# Patient Record
Sex: Female | Born: 1960 | Race: White | Hispanic: No | Marital: Married | Smoking: Current every day smoker
Health system: Southern US, Community
[De-identification: ages and names within clinical notes are randomized; demographics above are authoritative.]

## PROBLEM LIST (undated history)

## (undated) HISTORY — PX: BACK SURGERY: SHX140

## (undated) HISTORY — PX: TUBAL LIGATION: SHX77

## (undated) HISTORY — PX: ABDOMINAL HYSTERECTOMY: SHX81

---

## 2001-09-16 ENCOUNTER — Encounter: Payer: Self-pay | Admitting: Orthopedic Surgery

## 2001-09-16 ENCOUNTER — Ambulatory Visit (HOSPITAL_COMMUNITY): Admission: RE | Admit: 2001-09-16 | Discharge: 2001-09-16 | Payer: Self-pay | Admitting: Orthopedic Surgery

## 2004-12-20 ENCOUNTER — Ambulatory Visit: Payer: Self-pay | Admitting: Internal Medicine

## 2007-01-24 ENCOUNTER — Ambulatory Visit: Payer: Self-pay | Admitting: Family Medicine

## 2008-04-07 ENCOUNTER — Ambulatory Visit: Payer: Self-pay | Admitting: General Practice

## 2009-12-21 ENCOUNTER — Ambulatory Visit: Payer: Self-pay | Admitting: Obstetrics and Gynecology

## 2009-12-27 ENCOUNTER — Ambulatory Visit: Payer: Self-pay | Admitting: Obstetrics and Gynecology

## 2010-04-18 ENCOUNTER — Ambulatory Visit: Payer: Self-pay | Admitting: Family Medicine

## 2011-10-26 ENCOUNTER — Ambulatory Visit: Payer: Self-pay | Admitting: Family Medicine

## 2011-12-18 ENCOUNTER — Ambulatory Visit: Payer: Self-pay | Admitting: Family Medicine

## 2013-04-24 ENCOUNTER — Ambulatory Visit: Payer: Self-pay | Admitting: Family Medicine

## 2013-10-29 ENCOUNTER — Ambulatory Visit: Payer: Self-pay | Admitting: Family Medicine

## 2014-11-10 ENCOUNTER — Other Ambulatory Visit: Payer: Self-pay | Admitting: Family Medicine

## 2014-11-10 DIAGNOSIS — Z1231 Encounter for screening mammogram for malignant neoplasm of breast: Secondary | ICD-10-CM

## 2014-11-19 ENCOUNTER — Ambulatory Visit
Admission: RE | Admit: 2014-11-19 | Discharge: 2014-11-19 | Disposition: A | Discharge status: Home / Self Care | Payer: BLUE CROSS/BLUE SHIELD | Source: Ambulatory Visit | Attending: Internal Medicine | Admitting: Internal Medicine

## 2014-11-19 DIAGNOSIS — Z1231 Encounter for screening mammogram for malignant neoplasm of breast: Secondary | ICD-10-CM | POA: Insufficient documentation

## 2015-10-04 ENCOUNTER — Ambulatory Visit
Admission: RE | Admit: 2015-10-04 | Discharge: 2015-10-04 | Disposition: A | Discharge status: Home / Self Care | Payer: BLUE CROSS/BLUE SHIELD | Source: Ambulatory Visit | Attending: Family Medicine | Admitting: Family Medicine

## 2015-10-04 ENCOUNTER — Other Ambulatory Visit: Payer: Self-pay | Admitting: Family Medicine

## 2015-10-04 DIAGNOSIS — R0602 Shortness of breath: Secondary | ICD-10-CM

## 2015-10-04 DIAGNOSIS — R05 Cough: Secondary | ICD-10-CM

## 2015-10-04 DIAGNOSIS — R059 Cough, unspecified: Secondary | ICD-10-CM

## 2016-11-07 IMAGING — CR DG CHEST 2V
2 series · 2 of 2 positions shown · non-contrast
Comparison: 04/24/2013 and earlier.

CLINICAL DATA: 54-year-old female with shortness of breath wheezing
and cough for 7 days. Initial encounter. Smoker.

EXAM:
CHEST  2 VIEW

[chest pa]
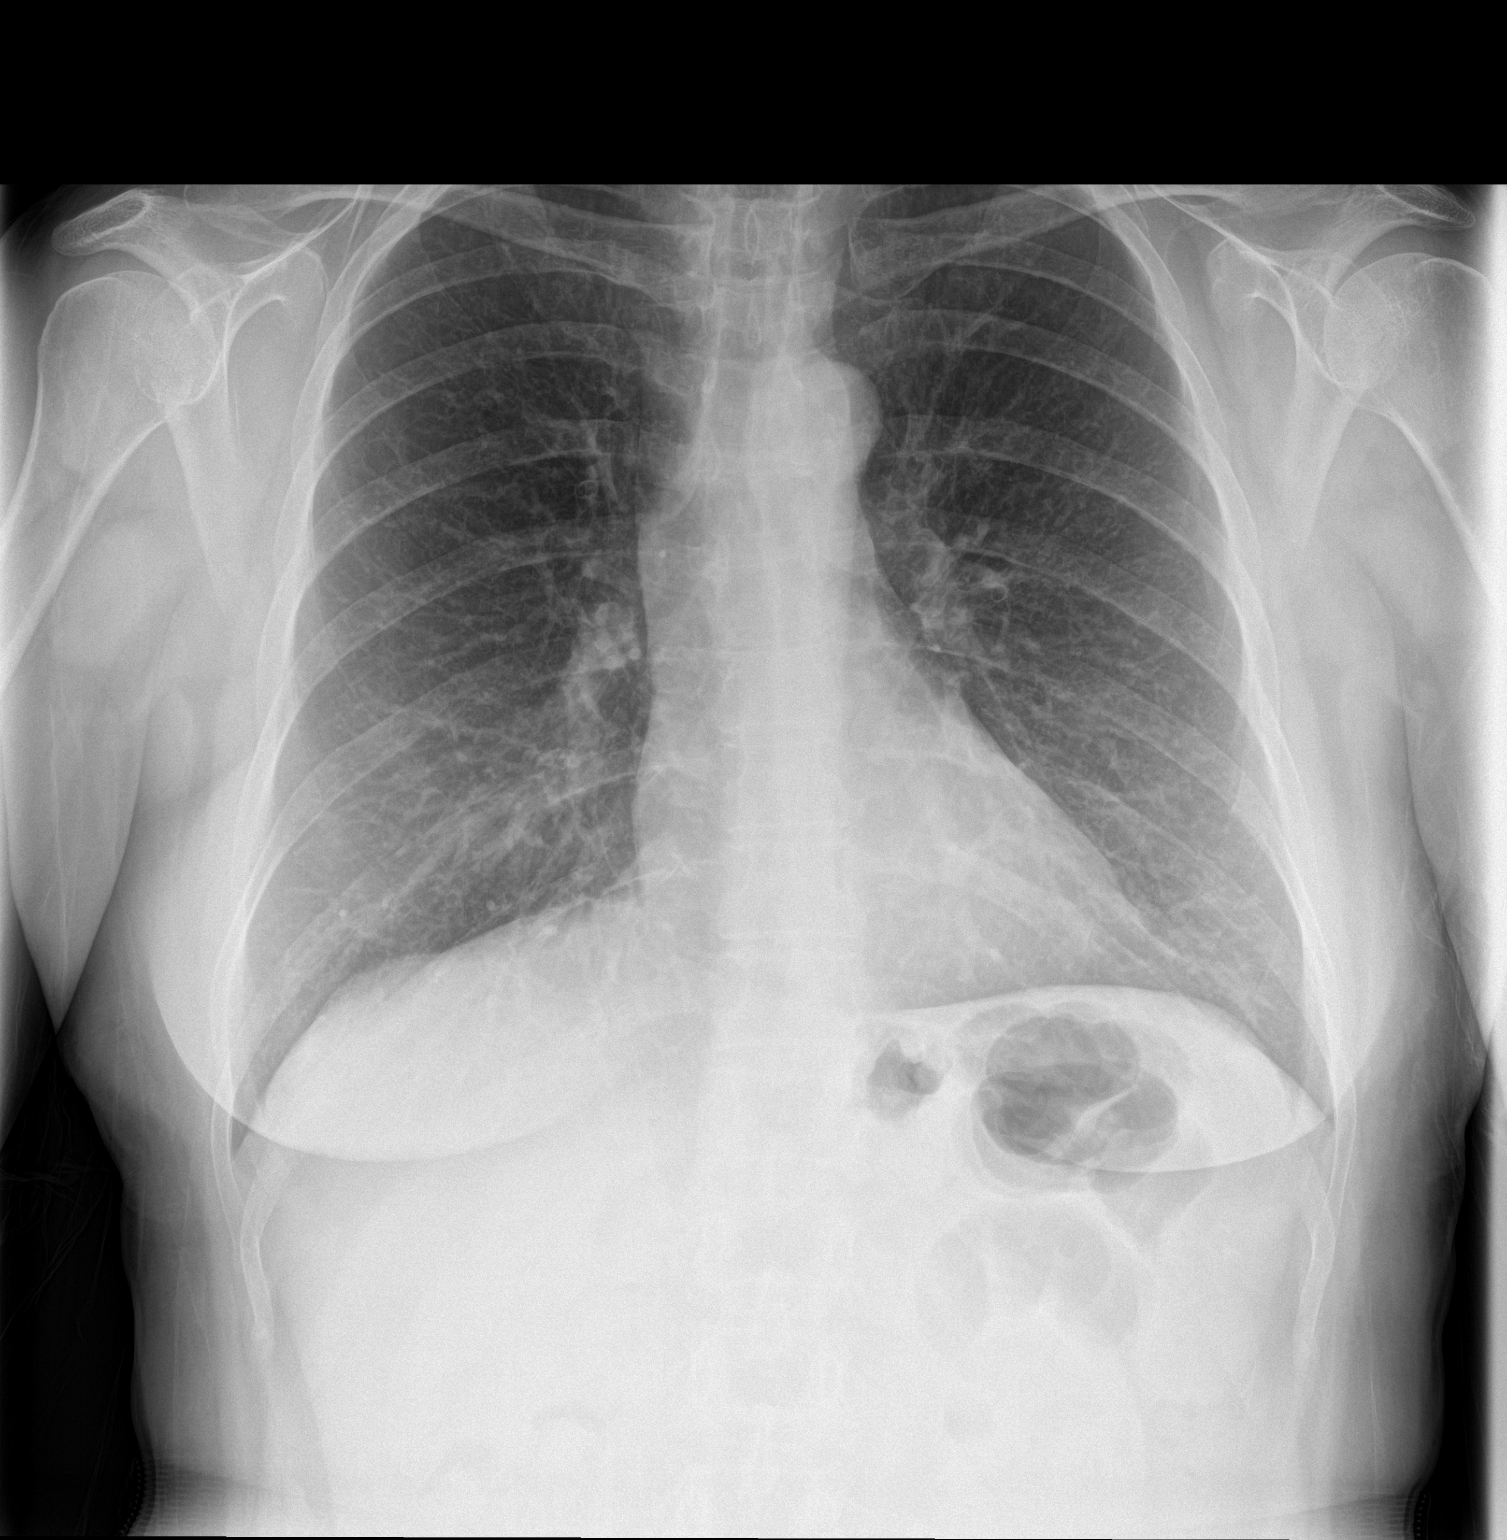

[chest lat]
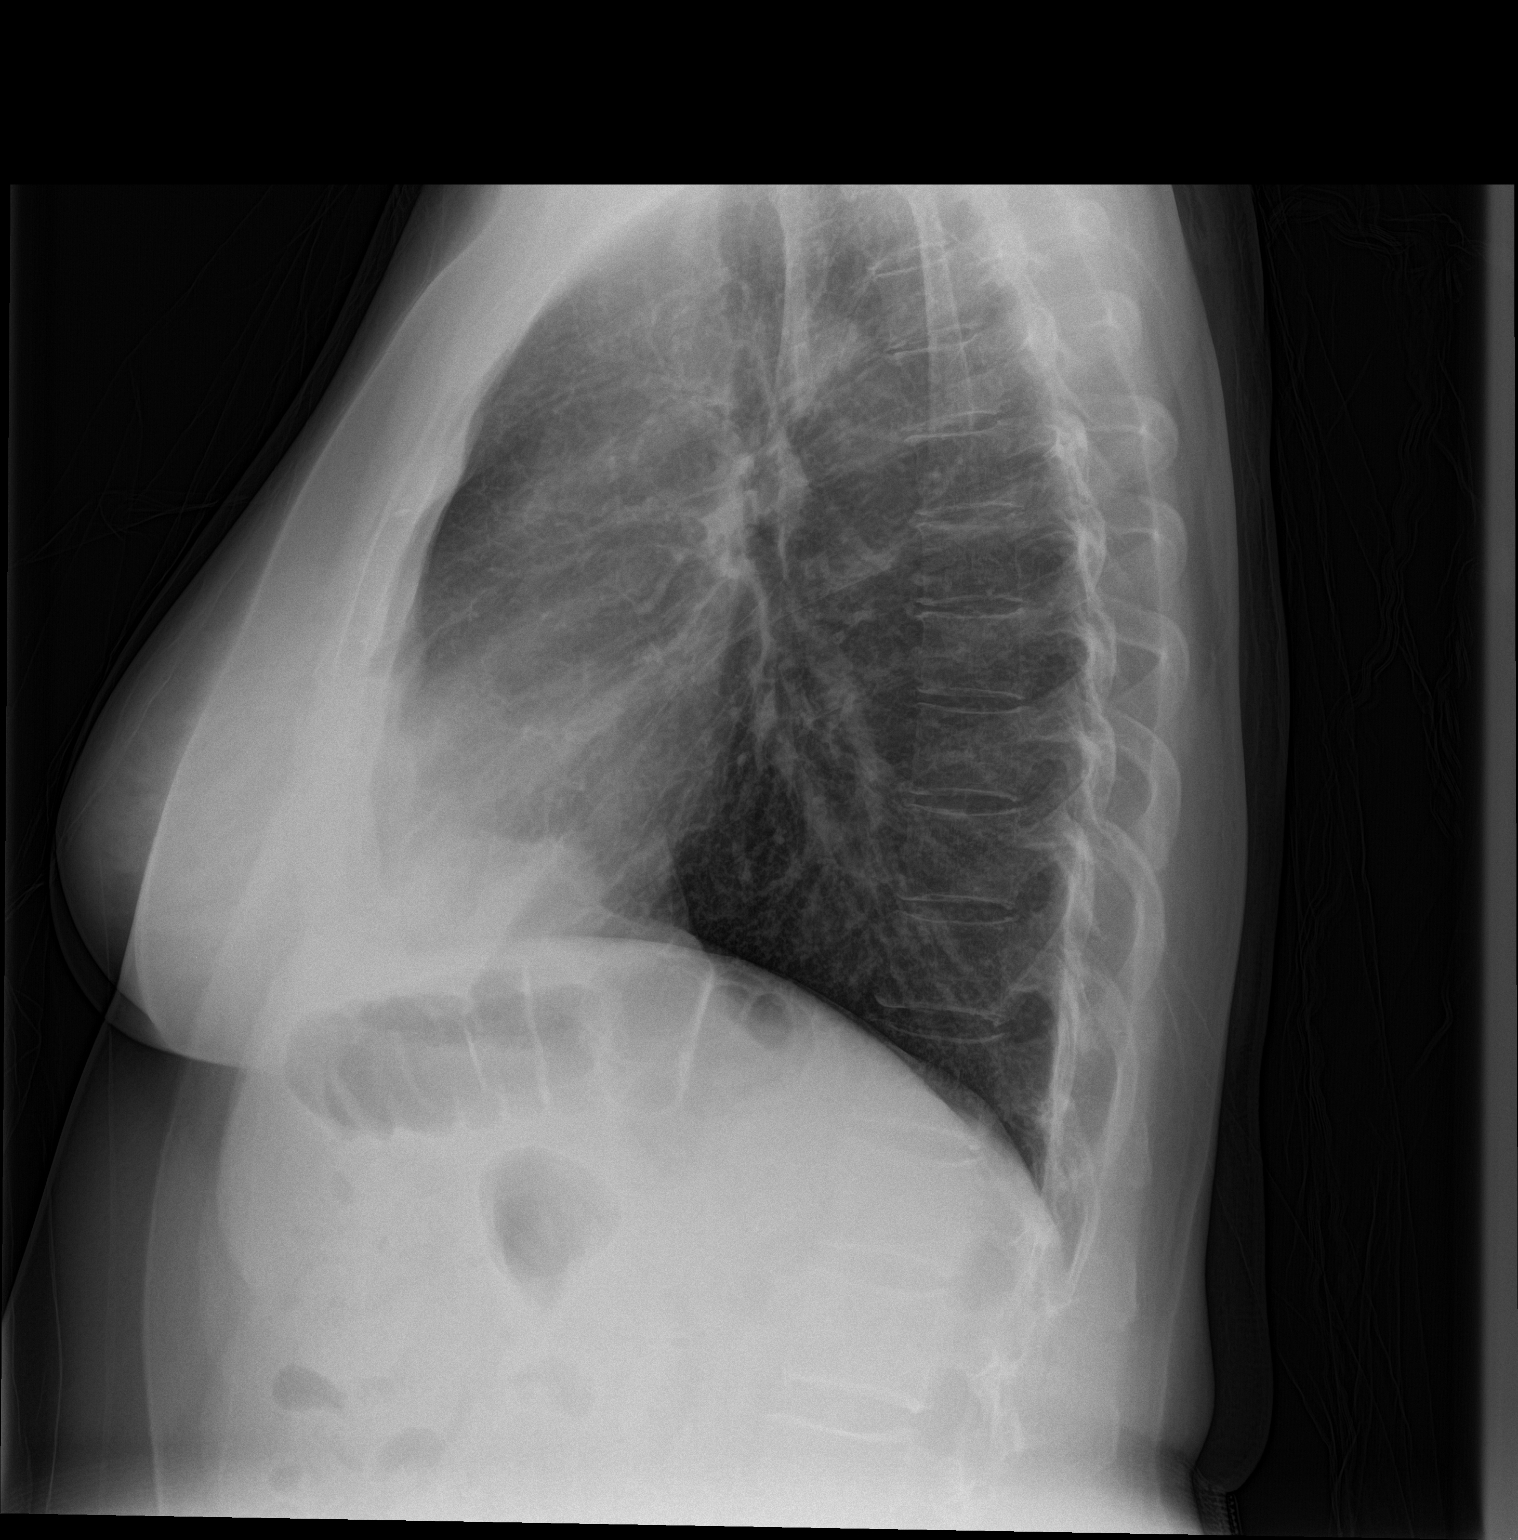

[2 of 2 positions shown; findings below may reference images not displayed]

FINDINGS: Lung volumes are stable and within normal limits. Normal cardiac
size and mediastinal contours. Visualized tracheal air column is
within normal limits. No pneumothorax, pulmonary edema, pleural
effusion or consolidation. Basilar predominant increased
interstitial markings have mildly progressed since 2041. No
confluent pulmonary opacity. No acute osseous abnormality
identified.
IMPRESSION: No acute cardiopulmonary abnormality.

## 2020-03-15 ENCOUNTER — Other Ambulatory Visit: Payer: Self-pay

## 2020-03-15 ENCOUNTER — Ambulatory Visit
Admission: EM | Admit: 2020-03-15 | Discharge: 2020-03-15 | Disposition: A | Discharge status: Home / Self Care | Payer: BC Managed Care – PPO | Attending: Emergency Medicine | Admitting: Emergency Medicine

## 2020-03-15 ENCOUNTER — Encounter: Payer: Self-pay | Admitting: Emergency Medicine

## 2020-03-15 DIAGNOSIS — Z20822 Contact with and (suspected) exposure to covid-19: Secondary | ICD-10-CM

## 2020-03-15 NOTE — ED Triage Notes (Signed)
Exposed to covid + person at work.  No s/s

## 2020-03-17 LAB — NOVEL CORONAVIRUS, NAA

## 2020-03-19 ENCOUNTER — Ambulatory Visit
Admission: EM | Admit: 2020-03-19 | Discharge: 2020-03-19 | Disposition: A | Discharge status: Home / Self Care | Payer: BC Managed Care – PPO | Attending: Emergency Medicine | Admitting: Emergency Medicine

## 2020-03-19 ENCOUNTER — Other Ambulatory Visit: Payer: Self-pay

## 2020-03-19 NOTE — ED Triage Notes (Signed)
Pt here for recollection

## 2020-03-20 LAB — NOVEL CORONAVIRUS, NAA: SARS-CoV-2, NAA: NOT DETECTED

## 2020-06-19 ENCOUNTER — Ambulatory Visit
Admission: EM | Admit: 2020-06-19 | Discharge: 2020-06-19 | Disposition: A | Discharge status: Home / Self Care | Payer: BC Managed Care – PPO | Attending: Emergency Medicine | Admitting: Emergency Medicine

## 2020-06-19 ENCOUNTER — Encounter: Payer: Self-pay | Admitting: Emergency Medicine

## 2020-06-19 DIAGNOSIS — Z1152 Encounter for screening for COVID-19: Secondary | ICD-10-CM

## 2020-06-19 NOTE — ED Triage Notes (Signed)
Had positive covid test on 06/07/2020.  Needs to be seen by medical professional to return to work.  Pt reports she is still having some mild fatigue.

## 2020-06-19 NOTE — Discharge Instructions (Signed)
Work note was given Follow-up with PCP  Return or go to ED if you develop any new or worsening symptoms

## 2020-06-19 NOTE — ED Provider Notes (Signed)
[facility]   [id number] 06/19/20 Arrival Time: 1037   CC: COVID   SUBJECTIVE: History from: patient.  Leah Moyer is a 59 y.o. female presented to the urgent care with a complaint of that she needs to return to work after completing 14 days of quarantine.  she tested positive for Covid on 06/07/2020.  States all her symptom has resolved.  Denies previous symptoms in the past.   Denies fever, chills, sinus pain, rhinorrhea, sore throat, SOB, wheezing, chest pain, nausea, changes in bowel or bladder habits.       ROS: As per HPI.  All other pertinent ROS negative.      History reviewed. No pertinent past medical history. Past Surgical History:  Procedure Laterality Date  . ABDOMINAL HYSTERECTOMY    . BACK SURGERY    . CESAREAN SECTION    . TUBAL LIGATION     Allergies  Allergen Reactions  . Alpha-Gal   . Codeine Rash  . Ibuprofen Rash  . Penicillins Rash  . Sulfa Antibiotics Rash   No current facility-administered medications on file prior to encounter.   No current outpatient medications on file prior to encounter.   Social History   Socioeconomic History  . Marital status: Married    Spouse name: Not on file  . Number of children: Not on file  . Years of education: Not on file  . Highest education level: Not on file  Occupational History  . Not on file  Tobacco Use  . Smoking status: Current Every Day Smoker    Packs/day: 1.00  . Smokeless tobacco: Never Used  Substance and Sexual Activity  . Alcohol use: Never  . Drug use: Never  . Sexual activity: Not on file  Other Topics Concern  . Not on file  Social History Narrative  . Not on file   Social Determinants of Health   Financial Resource Strain:   . Difficulty of Paying Living Expenses: Not on file  Food Insecurity:   . Worried About Programme researcher, broadcasting/film/video in the Last Year: Not on file  . Ran Out of Food in the Last Year: Not on file  Transportation Needs:   . Lack of Transportation  (Medical): Not on file  . Lack of Transportation (Non-Medical): Not on file  Physical Activity:   . Days of Exercise per Week: Not on file  . Minutes of Exercise per Session: Not on file  Stress:   . Feeling of Stress : Not on file  Social Connections:   . Frequency of Communication with Friends and Family: Not on file  . Frequency of Social Gatherings with Friends and Family: Not on file  . Attends Religious Services: Not on file  . Active Member of Clubs or Organizations: Not on file  . Attends Banker Meetings: Not on file  . Marital Status: Not on file  Intimate Partner Violence:   . Fear of Current or Ex-Partner: Not on file  . Emotionally Abused: Not on file  . Physically Abused: Not on file  . Sexually Abused: Not on file   Family History  Problem Relation Age of Onset  . Breast cancer Paternal Aunt 81    OBJECTIVE:  Vitals:   06/19/20 1128 06/19/20 1135  BP:  130/86  Pulse:  96  Resp:  18  Temp:  98 F (36.7 C)  TempSrc:  Oral  SpO2:  97%  Weight: 171 lb 15.3 oz (78 kg)   Height: 5\' 4"  (  1.626 m)      General appearance: alert; appears fatigued, but nontoxic; speaking in full sentences and tolerating own secretions HEENT: NCAT; Ears: EACs clear, TMs pearly gray; Eyes: PERRL.  EOM grossly intact. Sinuses: nontender; Nose: nares patent without rhinorrhea, Throat: oropharynx clear, tonsils non erythematous or enlarged, uvula midline  Neck: supple without LAD Lungs: unlabored respirations, symmetrical air entry; cough: not present; no respiratory distress; CTAB Heart: regular rate and rhythm.  Radial pulses 2+ symmetrical bilaterally Skin: warm and dry Psychological: alert and cooperative; normal mood and affect  LABS:  No results found for this or any previous visit (from the past 24 hour(s)).   ASSESSMENT & PLAN:  1. Encounter for screening for COVID-19     No orders of the defined types were placed in this encounter.  Patient is cleared to  return to work as she quarantine for 14 days and is symptom-free.  Discharge instructions  Work note was given Follow-up with PCP  Return or go to ED if you develop any new or worsening symptoms  Reviewed expectations re: course of current medical issues. Questions answered. Outlined signs and symptoms indicating need for more acute intervention. Patient verbalized understanding. After Visit Summary given.         Durward Parcel, FNP 06/19/20 1213

## 2020-06-21 LAB — NOVEL CORONAVIRUS, NAA: SARS-CoV-2, NAA: DETECTED — AB

## 2020-06-21 LAB — SARS-COV-2, NAA 2 DAY TAT

## 2020-11-02 ENCOUNTER — Encounter: Payer: Self-pay | Admitting: Emergency Medicine

## 2020-11-02 ENCOUNTER — Ambulatory Visit
Admission: EM | Admit: 2020-11-02 | Discharge: 2020-11-02 | Disposition: A | Discharge status: Home / Self Care | Payer: BC Managed Care – PPO | Attending: Family Medicine | Admitting: Family Medicine

## 2020-11-02 ENCOUNTER — Other Ambulatory Visit: Payer: Self-pay

## 2020-11-02 DIAGNOSIS — J069 Acute upper respiratory infection, unspecified: Secondary | ICD-10-CM | POA: Diagnosis not present

## 2020-11-02 DIAGNOSIS — J0140 Acute pansinusitis, unspecified: Secondary | ICD-10-CM

## 2020-11-02 MED ORDER — AZITHROMYCIN 250 MG PO TABS: 250.0000 mg | ORAL_TABLET | Freq: Every day | ORAL | 0 refills | Status: DC

## 2020-11-02 NOTE — Discharge Instructions (Addendum)
I have sent in azithromycin for you to take. Take 2 tablets today, then one tablet daily for the next 4 days.  Follow up with this office or with primary care if symptoms are persisting.  Follow up in the ER for high fever, trouble swallowing, trouble breathing, other concerning symptoms.   

## 2020-11-02 NOTE — ED Triage Notes (Signed)
Sinus congestion and pressure since Saturday. Neg home covid test.  otc meds not helping

## 2020-11-03 NOTE — ED Provider Notes (Signed)
[facility]   [id number] 11/02/20 Arrival Time: 1700  MV:HQIO THROAT  SUBJECTIVE: History from: patient.  Leah Moyer is a 60 y.o. female who presents with abrupt onset of nasal congestion, headache, fatigue for the last week. Reports that she is a current daily smoker. Denies sick exposure to Covid, strep, flu or mono, or precipitating event. Has positive history of Covid. Has not had Covid vaccines. Has not had flu vaccine. Has tried OTC cough and cold  without relief. There are no aggravating factors. Denies previous symptoms in the past.     Denies fever, chills, ear pain, SOB, wheezing, chest pain, nausea, rash, changes in bowel or bladder habits.    ROS: As per HPI.  All other pertinent ROS negative.     History reviewed. No pertinent past medical history. Past Surgical History:  Procedure Laterality Date  . ABDOMINAL HYSTERECTOMY    . BACK SURGERY    . CESAREAN SECTION    . TUBAL LIGATION     Allergies  Allergen Reactions  . Alpha-Gal   . Codeine Rash  . Ibuprofen Rash  . Penicillins Rash  . Sulfa Antibiotics Rash   No current facility-administered medications on file prior to encounter.   No current outpatient medications on file prior to encounter.   Social History   Socioeconomic History  . Marital status: Married    Spouse name: Not on file  . Number of children: Not on file  . Years of education: Not on file  . Highest education level: Not on file  Occupational History  . Not on file  Tobacco Use  . Smoking status: Current Every Day Smoker    Packs/day: 1.00  . Smokeless tobacco: Never Used  Substance and Sexual Activity  . Alcohol use: Never  . Drug use: Never  . Sexual activity: Not on file  Other Topics Concern  . Not on file  Social History Narrative  . Not on file   Social Determinants of Health   Financial Resource Strain: Not on file  Food Insecurity: Not on file  Transportation Needs: Not on file  Physical Activity: Not  on file  Stress: Not on file  Social Connections: Not on file  Intimate Partner Violence: Not on file   Family History  Problem Relation Age of Onset  . Breast cancer Paternal Aunt 11    OBJECTIVE:  Vitals:   11/02/20 1725  BP: (!) 163/90  Pulse: 93  Resp: 19  Temp: 98 F (36.7 C)  TempSrc: Oral  SpO2: 97%     General appearance: alert; appears fatigued, but nontoxic, speaking in full sentences and managing own secretions HEENT: NCAT; Ears: EACs clear, TMs pearly gray with visible cone of light, without erythema; Eyes: PERRL, EOMI grossly; Nose: no obvious rhinorrhea; Throat: oropharynx mildly erythematous, cobblestoning present, tonsils 1+ and mildly erythematous without white tonsillar exudates, uvula midline; Sinuses: frontal and maxillary sinuses tender to palpation Neck: supple with LAD Lungs: CTA bilaterally without adventitious breath sounds; cough absent Heart: regular rate and rhythm.  Radial pulses 2+ symmetrical bilaterally Skin: warm and dry Psychological: alert and cooperative; normal mood and affect  LABS: No results found for this or any previous visit (from the past 24 hour(s)).   ASSESSMENT & PLAN:  1. Acute URI   2. Acute non-recurrent pansinusitis     Meds ordered this encounter  Medications  . azithromycin (ZITHROMAX) 250 MG tablet    Sig: Take 1 tablet (250 mg total) by mouth daily.  Take first 2 tablets together, then 1 every day until finished.    Dispense:  6 tablet    Refill:  0    Order Specific Question:   Supervising Provider    Answer:   Merrilee Jansky [id number]    Acute URI Push fluids and get rest Prescribed azithromycin Take as directed and to completion.  Drink warm or cool liquids, use throat lozenges, or popsicles to help alleviate symptoms Take OTC ibuprofen or tylenol as needed for pain May use Zyrtec D and flonase to help alleviate symptoms Follow up with PCP if symptoms persist Return or go to ER if you have any new or  worsening symptoms such as fever, chills, nausea, vomiting, worsening sore throat, cough, abdominal pain, chest pain, changes in bowel or bladder habits.   Reviewed expectations re: course of current medical issues. Questions answered. Outlined signs and symptoms indicating need for more acute intervention. Patient verbalized understanding. After Visit Summary given.          Moshe Cipro, NP 11/03/20 1007

## 2021-06-10 ENCOUNTER — Ambulatory Visit
Admission: EM | Admit: 2021-06-10 | Discharge: 2021-06-10 | Disposition: A | Discharge status: Home / Self Care | Payer: BC Managed Care – PPO | Attending: Urgent Care | Admitting: Urgent Care

## 2021-06-10 ENCOUNTER — Encounter: Payer: Self-pay | Admitting: Emergency Medicine

## 2021-06-10 ENCOUNTER — Other Ambulatory Visit: Payer: Self-pay

## 2021-06-10 DIAGNOSIS — R519 Headache, unspecified: Secondary | ICD-10-CM

## 2021-06-10 DIAGNOSIS — B349 Viral infection, unspecified: Secondary | ICD-10-CM | POA: Diagnosis not present

## 2021-06-10 DIAGNOSIS — R52 Pain, unspecified: Secondary | ICD-10-CM

## 2021-06-10 DIAGNOSIS — R051 Acute cough: Secondary | ICD-10-CM

## 2021-06-10 MED ORDER — ACETAMINOPHEN 325 MG PO TABS
650.0000 mg | ORAL_TABLET | Freq: Once | ORAL | Status: AC
  Administered 2021-06-10: 650 mg via ORAL

## 2021-06-10 MED ORDER — PROMETHAZINE-DM 6.25-15 MG/5ML PO SYRP: 5.0000 mL | ORAL_SOLUTION | Freq: Every evening | ORAL | 0 refills | Status: DC | PRN

## 2021-06-10 MED ORDER — BENZONATATE 100 MG PO CAPS
100.0000 mg | ORAL_CAPSULE | Freq: Three times a day (TID) | ORAL | 0 refills | Status: DC | PRN
Start: 2021-06-10 — End: 2022-02-23

## 2021-06-10 MED ORDER — OSELTAMIVIR PHOSPHATE 75 MG PO CAPS: 75.0000 mg | ORAL_CAPSULE | Freq: Two times a day (BID) | ORAL | 0 refills | Status: DC

## 2021-06-10 NOTE — ED Triage Notes (Signed)
PT reports cough, headache, low grade temp, fatigue, started yesterday.

## 2021-06-10 NOTE — ED Provider Notes (Signed)
[facility]   MRN: [id number] DOB: 09-Dec-1960  Subjective:   Leah Moyer is a 59 y.o. female presenting for 1 day history of acute onset coughing, body aches, sinus headaches, congestion, fatigue, malaise, subjective fever.  No sick contacts to her knowledge.  No chest pain, shortness of breath or wheezing.  No history of respiratory disorders.   Current Facility-Administered Medications:    acetaminophen (TYLENOL) tablet 650 mg, 650 mg, Oral, Once, Wallis Bamberg, PA-C  Current Outpatient Medications:    azithromycin (ZITHROMAX) 250 MG tablet, Take 1 tablet (250 mg total) by mouth daily. Take first 2 tablets together, then 1 every day until finished., Disp: 6 tablet, Rfl: 0   Allergies  Allergen Reactions   Alpha-Gal    Codeine Rash   Ibuprofen Rash   Penicillins Rash   Sulfa Antibiotics Rash    History reviewed. No pertinent past medical history.   Past Surgical History:  Procedure Laterality Date   ABDOMINAL HYSTERECTOMY     BACK SURGERY     CESAREAN SECTION     TUBAL LIGATION      Family History  Problem Relation Age of Onset   Breast cancer Paternal Aunt 50    Social History   Tobacco Use   Smoking status: Every Day    Packs/day: 1.00    Types: Cigarettes   Smokeless tobacco: Never  Substance Use Topics   Alcohol use: Never   Drug use: Never    ROS   Objective:   Vitals: BP (!) 172/96   Pulse (!) 114   Temp 99.7 F (37.6 C) (Temporal)   Resp 16   SpO2 96%   Physical Exam Constitutional:      General: She is not in acute distress.    Appearance: Normal appearance. She is well-developed. She is not ill-appearing, toxic-appearing or diaphoretic.  HENT:     Head: Normocephalic and atraumatic.     Nose: Nose normal.     Mouth/Throat:     Mouth: Mucous membranes are moist.  Eyes:     Extraocular Movements: Extraocular movements intact.     Pupils: Pupils are equal, round, and reactive to light.  Neck:     Meningeal:  Brudzinski's sign and Kernig's sign absent.  Cardiovascular:     Rate and Rhythm: Normal rate and regular rhythm.     Pulses: Normal pulses.     Heart sounds: Normal heart sounds. No murmur heard.   No friction rub. No gallop.  Pulmonary:     Effort: Pulmonary effort is normal. No respiratory distress.     Breath sounds: Normal breath sounds. No stridor. No wheezing, rhonchi or rales.  Musculoskeletal:     Cervical back: No rigidity.  Skin:    General: Skin is warm and dry.     Findings: No rash.  Neurological:     Mental Status: She is alert and oriented to person, place, and time.     Cranial Nerves: No cranial nerve deficit or facial asymmetry.     Motor: No weakness.     Coordination: Romberg sign negative. Coordination normal.     Gait: Gait normal.     Deep Tendon Reflexes: Reflexes normal.  Psychiatric:        Mood and Affect: Mood normal.        Behavior: Behavior normal.        Thought Content: Thought content normal.        Judgment: Judgment normal.    Assessment and Plan :  PDMP not reviewed this encounter.  1. Acute viral syndrome   2. Acute cough   3. Body aches   4. Generalized headache     Deferred imaging given clear cardiopulmonary exam, hemodynamically stable vital signs. Will cover for influenza with Tamiflu given symptom set, current incidence in the community.  Use supportive care, rest, fluids, hydration, light meals, schedule Tylenol and ibuprofen. Counseled patient on potential for adverse effects with medications prescribed today, patient verbalized understanding. ER and return-to-clinic precautions discussed, patient verbalized understanding.    Wallis Bamberg, [place] 06/10/21 [phone]

## 2021-06-11 LAB — COVID-19, FLU A+B NAA
Influenza A, NAA: DETECTED — AB
Influenza B, NAA: NOT DETECTED
SARS-CoV-2, NAA: NOT DETECTED

## 2021-07-15 ENCOUNTER — Ambulatory Visit
Admission: EM | Admit: 2021-07-15 | Discharge: 2021-07-15 | Disposition: A | Discharge status: Home / Self Care | Payer: BC Managed Care – PPO | Attending: Family Medicine | Admitting: Family Medicine

## 2021-07-15 ENCOUNTER — Other Ambulatory Visit: Payer: Self-pay

## 2021-07-15 ENCOUNTER — Encounter: Payer: Self-pay | Admitting: Emergency Medicine

## 2021-07-15 DIAGNOSIS — J069 Acute upper respiratory infection, unspecified: Secondary | ICD-10-CM

## 2021-07-15 DIAGNOSIS — J3089 Other allergic rhinitis: Secondary | ICD-10-CM | POA: Diagnosis not present

## 2021-07-15 DIAGNOSIS — J0100 Acute maxillary sinusitis, unspecified: Secondary | ICD-10-CM | POA: Diagnosis not present

## 2021-07-15 MED ORDER — AZITHROMYCIN 250 MG PO TABS: 250.0000 mg | ORAL_TABLET | Freq: Every day | ORAL | 0 refills | Status: DC

## 2021-07-15 MED ORDER — FLUTICASONE PROPIONATE 50 MCG/ACT NA SUSP: 1.0000 | Freq: Two times a day (BID) | NASAL | 2 refills | Status: DC

## 2021-07-15 NOTE — ED Provider Notes (Signed)
[facility]    CSN: [id number] Arrival date & time: 07/15/21  0800      History   Chief Complaint No chief complaint on file.   HPI Leah Moyer is a 60 y.o. female.   Presenting today with over a week of progressively worsening sinus pain and pressure, nasal congestion, productive cough, fatigue, scratchy throat.  Denies fever, chills, body aches, chest pain, shortness of breath, abdominal pain, nausea vomiting or diarrhea.  Taking Mucinex and Tylenol sinus with minimal relief.  She states that she gets sinus infections about twice a year with the weather changes typically.   History reviewed. No pertinent past medical history.  There are no problems to display for this patient.   Past Surgical History:  Procedure Laterality Date   ABDOMINAL HYSTERECTOMY     BACK SURGERY     CESAREAN SECTION     TUBAL LIGATION      OB History   No obstetric history on file.      Home Medications    Prior to Admission medications   Medication Sig Start Date End Date Taking? Authorizing Provider  fluticasone (FLONASE) 50 MCG/ACT nasal spray Place 1 spray into both nostrils 2 (two) times daily. 07/15/21  Yes Particia Nearing, PA-C  azithromycin (ZITHROMAX) 250 MG tablet Take 1 tablet (250 mg total) by mouth daily. Take first 2 tablets together, then 1 every day until finished. 07/15/21   Particia Nearing, PA-C  benzonatate (TESSALON) 100 MG capsule Take 1-2 capsules (100-200 mg total) by mouth 3 (three) times daily as needed for cough. 06/10/21   Wallis Bamberg, PA-C  oseltamivir (TAMIFLU) 75 MG capsule Take 1 capsule (75 mg total) by mouth 2 (two) times daily. 06/10/21   Wallis Bamberg, PA-C  promethazine-dextromethorphan (PROMETHAZINE-DM) 6.25-15 MG/5ML syrup Take 5 mLs by mouth at bedtime as needed for cough. 06/10/21   Wallis Bamberg, PA-C    Family History Family History  Problem Relation Age of Onset   Breast cancer Paternal Aunt 29    Social  History Social History   Tobacco Use   Smoking status: Every Day    Packs/day: 1.00    Types: Cigarettes   Smokeless tobacco: Never  Substance Use Topics   Alcohol use: Never   Drug use: Never     Allergies   Alpha-gal, Codeine, Ibuprofen, Penicillins, and Sulfa antibiotics   Review of Systems Review of Systems Per HPI  Physical Exam Triage Vital Signs ED Triage Vitals  Enc Vitals Group     BP 07/15/21 0809 139/88     Pulse Rate 07/15/21 0809 99     Resp 07/15/21 0809 18     Temp 07/15/21 0809 98.3 F (36.8 C)     Temp Source 07/15/21 0809 Oral     SpO2 07/15/21 0809 96 %     Weight --      Height --      Head Circumference --      Peak Flow --      Pain Score 07/15/21 0810 8     Pain Loc --      Pain Edu? --      Excl. in GC? --    No data found.  Updated Vital Signs BP 139/88 (BP Location: Right Arm)    Pulse 99    Temp 98.3 F (36.8 C) (Oral)    Resp 18    SpO2 96%   Visual Acuity Right Eye Distance:   Left Eye Distance:  Bilateral Distance:    Right Eye Near:   Left Eye Near:    Bilateral Near:     Physical Exam Vitals and nursing note reviewed.  Constitutional:      Appearance: Normal appearance.  HENT:     Head: Atraumatic.     Right Ear: Tympanic membrane and external ear normal.     Left Ear: Tympanic membrane and external ear normal.     Nose: Congestion present.     Comments: Bilateral maxillary sinuses tender to palpation    Mouth/Throat:     Mouth: Mucous membranes are moist.     Pharynx: Posterior oropharyngeal erythema present.  Eyes:     Extraocular Movements: Extraocular movements intact.     Conjunctiva/sclera: Conjunctivae normal.  Cardiovascular:     Rate and Rhythm: Normal rate and regular rhythm.     Heart sounds: Normal heart sounds.  Pulmonary:     Effort: Pulmonary effort is normal.     Breath sounds: Normal breath sounds. No wheezing.  Musculoskeletal:        General: Normal range of motion.     Cervical back:  Normal range of motion and neck supple.  Skin:    General: Skin is warm and dry.  Neurological:     Mental Status: She is alert and oriented to person, place, and time.  Psychiatric:        Mood and Affect: Mood normal.        Thought Content: Thought content normal.     UC Treatments / Results  Labs (all labs ordered are listed, but only abnormal results are displayed) Labs Reviewed - No data to display  EKG   Radiology No results found.  Procedures Procedures (including critical care time)  Medications Ordered in UC Medications - No data to display  Initial Impression / Assessment and Plan / UC Course  I have reviewed the triage vital signs and the nursing notes.  Pertinent labs & imaging results that were available during my care of the patient were reviewed by me and considered in my medical decision making (see chart for details).     Treat with Augmentin for maxillary sinusitis but suspect strong allergy component underlying, particular with recurrence of her sinus infections around weather change.  Discussed Zyrtec, Flonase, sinus rinses and other supportive measures to help prevent infections going forward.  Return for acutely worsening symptoms  Final Clinical Impressions(s) / UC Diagnoses   Final diagnoses:  Acute maxillary sinusitis, recurrence not specified  Seasonal allergic rhinitis due to other allergic trigger   Discharge Instructions   None    ED Prescriptions     Medication Sig Dispense Auth. Provider   azithromycin (ZITHROMAX) 250 MG tablet Take 1 tablet (250 mg total) by mouth daily. Take first 2 tablets together, then 1 every day until finished. 6 tablet Particia Nearing, PA-C   fluticasone [facility]) 50 MCG/ACT nasal spray Place 1 spray into both nostrils 2 (two) times daily. 16 g Particia Nearing, [place]      PDMP not reviewed this encounter.   Roosvelt Maser [place], [place] 07/15/21 [phone]

## 2021-07-15 NOTE — ED Triage Notes (Signed)
Sinus congestion, productive cough x 1 week.

## 2022-02-20 ENCOUNTER — Ambulatory Visit
Admission: EM | Admit: 2022-02-20 | Discharge: 2022-02-20 | Disposition: A | Discharge status: Home / Self Care | Payer: BC Managed Care – PPO | Attending: Family Medicine | Admitting: Family Medicine

## 2022-02-20 DIAGNOSIS — R1011 Right upper quadrant pain: Secondary | ICD-10-CM

## 2022-02-20 MED ORDER — PANTOPRAZOLE SODIUM 40 MG PO TBEC: 40.0000 mg | DELAYED_RELEASE_TABLET | Freq: Every day | ORAL | 0 refills | Status: DC

## 2022-02-20 MED ORDER — SUCRALFATE 1 G PO TABS: 1.0000 g | ORAL_TABLET | Freq: Three times a day (TID) | ORAL | 0 refills | Status: AC | PRN

## 2022-02-20 NOTE — ED Provider Notes (Addendum)
[facility]    CSN: [id number] Arrival date & time: 02/20/22  0803      History   Chief Complaint Chief Complaint  Patient presents with   Abdominal Pain    HPI Leah Moyer is a 61 y.o. female.   Presenting today with 3 weeks of off-and-on episodes of right upper quadrant pain.  She has been pain-free for several days but about 2 days ago the pain restarted and worsened last night.  She states seems to be worse after eating fried food, had fried chicken livers the day that the pain became worse again.  States the pain is a bit worse with taking a deep breath, but not worse with certain movements and not associated with any nausea, vomiting, diarrhea, fevers, chest pain, shortness of breath, urinary symptoms.  Has tried some Tums with minimal relief.  No known history of chronic GI issues and no past abdominal surgeries.    History reviewed. No pertinent past medical history.  There are no problems to display for this patient.   Past Surgical History:  Procedure Laterality Date   ABDOMINAL HYSTERECTOMY     BACK SURGERY     CESAREAN SECTION     TUBAL LIGATION      OB History   No obstetric history on file.      Home Medications    Prior to Admission medications   Medication Sig Start Date End Date Taking? Authorizing Provider  pantoprazole (PROTONIX) 40 MG tablet Take 1 tablet (40 mg total) by mouth daily. 02/20/22  Yes Particia Nearing, PA-C  sucralfate (CARAFATE) 1 g tablet Take 1 tablet (1 g total) by mouth 3 (three) times daily as needed. May dissolve 1 tab into glass of water and drink as needed 02/20/22  Yes Maurice March, Salley Hews, PA-C  azithromycin (ZITHROMAX) 250 MG tablet Take 1 tablet (250 mg total) by mouth daily. Take first 2 tablets together, then 1 every day until finished. 07/15/21   Particia Nearing, PA-C  benzonatate (TESSALON) 100 MG capsule Take 1-2 capsules (100-200 mg total) by mouth 3 (three) times daily as needed for cough.  06/10/21   Wallis Bamberg, PA-C  fluticasone (FLONASE) 50 MCG/ACT nasal spray Place 1 spray into both nostrils 2 (two) times daily. 07/15/21   Particia Nearing, PA-C  oseltamivir (TAMIFLU) 75 MG capsule Take 1 capsule (75 mg total) by mouth 2 (two) times daily. 06/10/21   Wallis Bamberg, PA-C  promethazine-dextromethorphan (PROMETHAZINE-DM) 6.25-15 MG/5ML syrup Take 5 mLs by mouth at bedtime as needed for cough. 06/10/21   Wallis Bamberg, PA-C    Family History Family History  Problem Relation Age of Onset   Breast cancer Paternal Aunt 69    Social History Social History   Tobacco Use   Smoking status: Every Day    Packs/day: 1.00    Types: Cigarettes   Smokeless tobacco: Never  Substance Use Topics   Alcohol use: Never   Drug use: Never     Allergies   Alpha-gal, Codeine, Ibuprofen, Penicillins, and Sulfa antibiotics   Review of Systems Review of Systems Per HPI  Physical Exam Triage Vital Signs ED Triage Vitals [02/20/22 0816]  Enc Vitals Group     BP      Pulse      Resp      Temp      Temp src      SpO2      Weight      Height  Head Circumference      Peak Flow      Pain Score 8     Pain Loc      Pain Edu?      Excl. in [place]?    No data found.  Updated Vital Signs BP (!) 155/85   Pulse 92   Temp 98.2 F (36.8 C) (Oral)   Resp 18   SpO2 96%   Visual Acuity Right Eye Distance:   Left Eye Distance:   Bilateral Distance:    Right Eye Near:   Left Eye Near:    Bilateral Near:     Physical Exam Vitals and nursing note reviewed.  Constitutional:      Appearance: Normal appearance. She is not ill-appearing.  HENT:     Head: Atraumatic.     Mouth/Throat:     Mouth: Mucous membranes are moist.  Eyes:     Extraocular Movements: Extraocular movements intact.     Conjunctiva/sclera: Conjunctivae normal.  Cardiovascular:     Rate and Rhythm: Normal rate and regular rhythm.     Heart sounds: Normal heart sounds.  Pulmonary:     Effort:  Pulmonary effort is normal.     Breath sounds: Normal breath sounds.  Abdominal:     General: Bowel sounds are normal. There is no distension.     Palpations: Abdomen is soft.     Tenderness: There is abdominal tenderness. There is no right CVA tenderness, left CVA tenderness or guarding.     Comments: Right upper quadrant tenderness to palpation, epigastric tenderness to palpation.  Negative Murphy sign  Musculoskeletal:        General: Normal range of motion.     Cervical back: Normal range of motion and neck supple.  Skin:    General: Skin is warm and dry.  Neurological:     Mental Status: She is alert and oriented to person, place, and time.  Psychiatric:        Mood and Affect: Mood normal.        Thought Content: Thought content normal.        Judgment: Judgment normal.    UC Treatments / Results  Labs (all labs ordered are listed, but only abnormal results are displayed) Labs Reviewed - No data to display  EKG  Radiology No results found.  Procedures Procedures (including critical care time)  Medications Ordered in UC Medications - No data to display  Initial Impression / Assessment and Plan / UC Course  I have reviewed the triage vital signs and the nursing notes.  Pertinent labs & imaging results that were available during my care of the patient were reviewed by me and considered in my medical decision making (see chart for details).     Suspect biliary colic versus gastritis.  Discussed lifestyle changes, trial Protonix and Carafate though higher suspicion for biliary colic.  No evidence of cholecystitis today, afebrile, no nausea, vomiting or other systemic symptoms.  Pain appears episodic.  ED for any worsening symptoms.  GI information given as well as resources to find a primary care provider.  Work note given.  Final Clinical Impressions(s) / UC Diagnoses   Final diagnoses:  RUQ pain     Discharge Instructions      If your symptoms worsen at any  time, especially fevers, nausea, vomiting, unrelenting right upper abdominal pain please go to the ER immediately  You also may go to the Find a Provider tab on the [facility] website to  find a new Primary Care Provider and schedule an appointment    ED Prescriptions     Medication Sig Dispense Auth. Provider   sucralfate (CARAFATE) 1 g tablet Take 1 tablet (1 g total) by mouth 3 (three) times daily as needed. May dissolve 1 tab into glass of water and drink as needed 60 tablet Particia Nearing, PA-C   pantoprazole (PROTONIX) 40 MG tablet Take 1 tablet (40 mg total) by mouth daily. 30 tablet Particia Nearing, [place]      PDMP not reviewed this encounter.   Particia Nearing, [place] 02/20/22 1314    Roosvelt Maser [place], [place] 02/20/22 1314

## 2022-02-20 NOTE — Discharge Instructions (Addendum)
If your symptoms worsen at any time, especially fevers, nausea, vomiting, unrelenting right upper abdominal pain please go to the ER immediately  You also may go to the Find a Provider tab on the [facility] website to find a new Primary Care Provider and schedule an appointment

## 2022-02-20 NOTE — ED Triage Notes (Signed)
Pt presents with right upper quadrant pain that began first about 3 weeks ago and went away then returned last night and has been constant

## 2022-02-23 ENCOUNTER — Ambulatory Visit (INDEPENDENT_AMBULATORY_CARE_PROVIDER_SITE_OTHER): Payer: BLUE CROSS/BLUE SHIELD | Admitting: Gastroenterology

## 2022-02-23 ENCOUNTER — Other Ambulatory Visit (INDEPENDENT_AMBULATORY_CARE_PROVIDER_SITE_OTHER): Payer: Self-pay

## 2022-02-23 ENCOUNTER — Encounter (INDEPENDENT_AMBULATORY_CARE_PROVIDER_SITE_OTHER): Payer: Self-pay

## 2022-02-23 ENCOUNTER — Telehealth (INDEPENDENT_AMBULATORY_CARE_PROVIDER_SITE_OTHER): Payer: Self-pay

## 2022-02-23 ENCOUNTER — Encounter (INDEPENDENT_AMBULATORY_CARE_PROVIDER_SITE_OTHER): Payer: Self-pay | Admitting: Gastroenterology

## 2022-02-23 VITALS — BP 152/91 | HR 89 | Temp 97.9°F | Ht 63.5 in | Wt 173.0 lb

## 2022-02-23 DIAGNOSIS — R1013 Epigastric pain: Secondary | ICD-10-CM

## 2022-02-23 DIAGNOSIS — Z1211 Encounter for screening for malignant neoplasm of colon: Secondary | ICD-10-CM

## 2022-02-23 MED ORDER — PEG 3350-KCL-NA BICARB-NACL 420 G PO SOLR: 4000.0000 mL | ORAL | 0 refills | Status: AC

## 2022-02-23 MED ORDER — PANTOPRAZOLE SODIUM 40 MG PO TBEC: 40.0000 mg | DELAYED_RELEASE_TABLET | Freq: Every day | ORAL | 1 refills | Status: AC

## 2022-02-23 NOTE — Patient Instructions (Signed)
It was nice to meet you As discussed, your exam is more indicative of an upper GI source as the cause of your symptoms such as inflammation in the stomach/small bowel or an ulcer. Please continue to avoid NSAIDs (advil, aleve, naproxen, goody powder, ibuprofen). Please keep taking pantoprazole 40mg  daily, I have sent a refill of this You can complete course of carafate I would try to do a more bland diet, avoiding greasy, spicy, heavy foods for now If you have another episode of pain, you can give me a call, if this is accompanied by vomiting that does not stop, fevers, chills, rectal bleeding or blood in your vomit, these are things you would need to proceed to the ER for.  We will get you setup for EGD and colonoscopy  Follow up 3 months

## 2022-02-23 NOTE — Progress Notes (Signed)
Referring Provider: No ref. provider found Primary Care Physician:  Pcp, No Primary GI Physician: new  Chief Complaint  Patient presents with   Abdominal Pain    Patient here today due to epigastric abdominal pain.She also has some RUQ pain. Has occasional constipation, she denies any other gi issues.   HPI:   Leah Moyer is a 61 y.o. female with no significant past medical history.   Patient presenting today as a new patient for epigastric and RUQ pain.   History: Recent UC visit on 02/20/22 with c/o 3 weeks of RUQ pain, worse with fried foods and a deep breath. Tried tums without much relief. Rx for carafate and protonix given.   Present: Patient reports that Sunday she began having sharp epigastric pain. First episode was after having a fried country ham biscuit on 7/15 with the Same presentation. Denies nausea or vomiting. Pain lasted about 24 hours. States she saw UC on monday due to severity of pain, reports some improvement with carafate and protonix, felt very good yesterday, though today she had epigastric and RUQ pain this morning. She did eat a strip of bacon yesterday. States no intervention seems to improve the pain. She even took a suppository the first episode to see if constipation was causing it. Laying on her Right side usually makes the pain worse. She is belching a lot. Denies heartburn or acid regurgitation. No nausea or vomiting. Denies radiation of pain to her back or between her shoulder blades. Denies any other new medications prior to symptoms starting. Denies rectal bleeding or black stools. Denies post prandial abdominal pain, no early satiety or weight loss.  Denies changes in bowel habits, has some constipation at baseline.   NSAID use: allergic reaction to ibuprofen in the past, does not take these Social hx:1 ppd cigs, alcohol maybe twice per year  Fam hx:no crc,liver disease or pancreatic cancer   Last Colonoscopy:never Last Endoscopy:  never  Recommendations:   History reviewed. No pertinent past medical history.  Past Surgical History:  Procedure Laterality Date   ABDOMINAL HYSTERECTOMY     BACK SURGERY     CESAREAN SECTION     TUBAL LIGATION      Current Outpatient Medications  Medication Sig Dispense Refill   pantoprazole (PROTONIX) 40 MG tablet Take 1 tablet (40 mg total) by mouth daily. 30 tablet 0   sucralfate (CARAFATE) 1 g tablet Take 1 tablet (1 g total) by mouth 3 (three) times daily as needed. May dissolve 1 tab into glass of water and drink as needed 60 tablet 0   No current facility-administered medications for this visit.    Allergies as of 02/23/2022 - Review Complete 02/23/2022  Allergen Reaction Noted   Alpha-gal  03/15/2020   Codeine Rash 03/15/2020   Ibuprofen Rash 06/19/2020   Penicillins Rash 03/15/2020   Sulfa antibiotics Rash 03/15/2020    Family History  Problem Relation Age of Onset   Breast cancer Paternal Aunt 40    Social History   Socioeconomic History   Marital status: Married    Spouse name: Not on file   Number of children: Not on file   Years of education: Not on file   Highest education level: Not on file  Occupational History   Not on file  Tobacco Use   Smoking status: Every Day    Packs/day: 1.00    Types: Cigarettes   Smokeless tobacco: Never  Vaping Use   Vaping Use: Never used  Substance  and Sexual Activity   Alcohol use: Never   Drug use: Never   Sexual activity: Not on file  Other Topics Concern   Not on file  Social History Narrative   Not on file   Social Determinants of Health   Financial Resource Strain: Not on file  Food Insecurity: Not on file  Transportation Needs: Not on file  Physical Activity: Not on file  Stress: Not on file  Social Connections: Not on file   Review of systems General: negative for malaise, night sweats, fever, chills, weight loss Neck: Negative for lumps, goiter, pain and significant neck swelling Resp:  Negative for cough, wheezing, dyspnea at rest CV: Negative for chest pain, leg swelling, palpitations, orthopnea GI: denies melena, hematochezia, nausea, vomiting, diarrhea, constipation, dysphagia, odyonophagia, early satiety or unintentional weight loss. +RUQ/epigastric pain MSK: Negative for joint pain or swelling, back pain, and muscle pain. Derm: Negative for itching or rash Psych: Denies depression, anxiety, memory loss, confusion. No homicidal or suicidal ideation.  Heme: Negative for prolonged bleeding, bruising easily, and swollen nodes. Endocrine: Negative for cold or heat intolerance, polyuria, polydipsia and goiter. Neuro: negative for tremor, gait imbalance, syncope and seizures. The remainder of the review of systems is noncontributory.  Physical Exam: BP (!) 152/91 (BP Location: Left Arm, Patient Position: Sitting, Cuff Size: Large)   Pulse 89   Temp 97.9 F (36.6 C) (Oral)   Ht 5' 3.5" (1.613 m)   Wt 173 lb (78.5 kg)   BMI 30.16 kg/m  General:   Alert and oriented. No distress noted. Pleasant and cooperative.  Head:  Normocephalic and atraumatic. Eyes:  Conjuctiva clear without scleral icterus. Mouth:  Oral mucosa pink and moist. Good dentition. No lesions. Heart: Normal rate and rhythm, s1 and s2 heart sounds present.  Lungs: Clear lung sounds in all lobes. Respirations equal and unlabored. Abdomen:  +BS, soft, and non-distended. Notably tender with palpation of epigastric and LUQ area, Negative Murphy's sign. No rebound or guarding. No HSM or masses noted. Derm: No palmar erythema or jaundice Msk:  Symmetrical without gross deformities. Normal posture. Extremities:  Without edema. Neurologic:  Alert and  oriented x4 Psych:  Alert and cooperative. Normal mood and affect.  Invalid input(s): "6 MONTHS"   ASSESSMENT: [name] is a 61 y.o. female presenting today for epigastric and RUQ pain.  Patient with new onset of Epigastric pain in mid July that last  approximately 24 hours, recurrence of pain this past Sunday, seen at [facility] Monday and started on PPI and carafate which seemed to help her pain though she is now having some RUQ pain. She denies rectal bleeding, melena, postprandial abdominal pain, nausea, vomiting or radiation of pain to her back or shoulders. Does not take NSAIDs or frequent ETOH, no GERD symptoms. On exam she presents with TTP of epigastric and LUQ areas. Discussed that while GB etiology is a differential, recommend proceeding with EGD at this time giving her overall clinical presentation as we cannot rule out PUD, gastritis, duodenitis or less likely malignancy. Should continue with daily PPI, and complete course of carafate as well as a bland diet. She will make me aware if she has worsening pain or pain accompanied by cyclic vomiting, rectal bleeding, fevers or melena.   Notably patient has never had CRC screening, she is of average risk, she is amenable to proceeding with screening colonoscopy at time of EGD.   Indications, risks and benefits of procedure discussed in detail with patient. Patient verbalized  understanding and is in agreement to proceed with EGD/Colonoscopy  at this time.   PLAN:  Continue PPI daily, refill sent  2. EGD and screening colonoscopy, ASA I  3. Complete course of carafate 4. Bland diet 5. Pt to make me aware of worsening symptoms  All questions were answered, patient verbalized understanding and is in agreement with plan as outlined above.   Follow Up: 3 months   Sharea Guinther L. Jeanmarie Hubert, MSN, APRN, AGNP-C Adult-Gerontology Nurse Practitioner [facility]

## 2022-02-23 NOTE — Telephone Encounter (Signed)
Leah Moyer Leah Moyer, CMA  ?

## 2022-03-29 ENCOUNTER — Ambulatory Visit (HOSPITAL_COMMUNITY)
Admission: RE | Admit: 2022-03-29 | Payer: BC Managed Care – PPO | Source: Home / Self Care | Admitting: Gastroenterology

## 2022-03-29 ENCOUNTER — Encounter (HOSPITAL_COMMUNITY): Admission: RE | Payer: Self-pay | Source: Home / Self Care

## 2022-03-29 SURGERY — COLONOSCOPY WITH PROPOFOL
Anesthesia: Monitor Anesthesia Care

## 2022-05-29 ENCOUNTER — Ambulatory Visit (INDEPENDENT_AMBULATORY_CARE_PROVIDER_SITE_OTHER): Payer: Self-pay | Admitting: Gastroenterology

## 2023-11-26 ENCOUNTER — Other Ambulatory Visit: Payer: Self-pay

## 2023-11-26 ENCOUNTER — Encounter: Payer: Self-pay | Admitting: Emergency Medicine

## 2023-11-26 ENCOUNTER — Ambulatory Visit
Admission: EM | Admit: 2023-11-26 | Discharge: 2023-11-26 | Disposition: A | Discharge status: Home / Self Care | Attending: Family Medicine | Admitting: Family Medicine

## 2023-11-26 DIAGNOSIS — H1032 Unspecified acute conjunctivitis, left eye: Secondary | ICD-10-CM | POA: Diagnosis not present

## 2023-11-26 MED ORDER — OFLOXACIN 0.3 % OP SOLN: 1.0000 [drp] | Freq: Four times a day (QID) | OPHTHALMIC | 0 refills | Status: AC

## 2023-11-26 NOTE — ED Triage Notes (Signed)
 Pt reports left eye drainage and reports "gritty feeling" since yesterday at 5am. Denies any known injury.

## 2023-11-26 NOTE — ED Provider Notes (Signed)
 [facility]    CSN: [id number] Arrival date & time: 11/26/23  0809      History   Chief Complaint Chief Complaint  Patient presents with   Eye Problem    HPI Leah Moyer is a 63 y.o. female.   Patient presenting today with 1 day history of left eye redness, drainage and matting, itching and irritation.  Denies injury to the eye, new chemical exposures or facial products used, visual change, headache, nausea, vomiting.  So far tried cool compresses with minimal relief.    History reviewed. No pertinent past medical history.  Patient Active Problem List   Diagnosis Date Noted   Encounter for screening colonoscopy 02/23/2022   Abdominal pain, epigastric 02/23/2022    Past Surgical History:  Procedure Laterality Date   ABDOMINAL HYSTERECTOMY     BACK SURGERY     CESAREAN SECTION     TUBAL LIGATION      OB History   No obstetric history on file.      Home Medications    Prior to Admission medications   Medication Sig Start Date End Date Taking? Authorizing Provider  ofloxacin (OCUFLOX) 0.3 % ophthalmic solution Place 1 drop into the left eye 4 (four) times daily. 11/26/23  Yes Corbin Dess, PA-C  pantoprazole  (PROTONIX ) 40 MG tablet Take 1 tablet (40 mg total) by mouth daily. 02/23/22   Carlan, Chelsea L, NP  polyethylene glycol-electrolytes (TRILYTE) 420 g solution Take 4,000 mLs by mouth as directed. 02/23/22   Urban Garden, MD  sucralfate  (CARAFATE ) 1 g tablet Take 1 tablet (1 g total) by mouth 3 (three) times daily as needed. May dissolve 1 tab into glass of water and drink as needed 02/20/22   Corbin Dess, PA-C    Family History Family History  Problem Relation Age of Onset   Breast cancer Paternal Aunt 61    Social History Social History   Tobacco Use   Smoking status: Every Day    Current packs/day: 1.00    Types: Cigarettes   Smokeless tobacco: Never  Vaping Use   Vaping status: Never Used  Substance  Use Topics   Alcohol use: Never   Drug use: Never     Allergies   Alpha-gal, Codeine, Ibuprofen, Penicillins, and Sulfa antibiotics   Review of Systems Review of Systems Per HPI  Physical Exam Triage Vital Signs ED Triage Vitals  Encounter Vitals Group     BP 11/26/23 0822 (!) 162/98     Systolic BP Percentile --      Diastolic BP Percentile --      Pulse Rate 11/26/23 0822 82     Resp 11/26/23 0822 20     Temp 11/26/23 0822 98.1 F (36.7 C)     Temp Source 11/26/23 0822 Oral     SpO2 11/26/23 0822 95 %     Weight --      Height --      Head Circumference --      Peak Flow --      Pain Score 11/26/23 0823 1     Pain Loc --      Pain Education --      Exclude from Growth Chart --    No data found.  Updated Vital Signs BP (!) 162/98 (BP Location: Right Arm)   Pulse 82   Temp 98.1 F (36.7 C) (Oral)   Resp 20   SpO2 95%   Visual Acuity Right Eye Distance: 20/25  Left Eye Distance: 20/25 Bilateral Distance: 20/20 (pt reports wears glasses and contacts for reading as needed. dose not have corrective lenses at this time.)  Right Eye Near:   Left Eye Near:    Bilateral Near:     Physical Exam Vitals and nursing note reviewed.  Constitutional:      Appearance: Normal appearance. She is not ill-appearing.  HENT:     Head: Atraumatic.     Mouth/Throat:     Mouth: Mucous membranes are moist.  Eyes:     Extraocular Movements: Extraocular movements intact.     Pupils: Pupils are equal, round, and reactive to light.     Comments: Left conjunctival erythema, injection, drainage.  No foreign body appreciable  Cardiovascular:     Rate and Rhythm: Normal rate.  Pulmonary:     Effort: Pulmonary effort is normal.  Musculoskeletal:        General: Normal range of motion.     Cervical back: Normal range of motion and neck supple.  Skin:    General: Skin is warm and dry.  Neurological:     Mental Status: She is alert and oriented to person, place, and time.   Psychiatric:        Mood and Affect: Mood normal.        Thought Content: Thought content normal.        Judgment: Judgment normal.      UC Treatments / Results  Labs (all labs ordered are listed, but only abnormal results are displayed) Labs Reviewed - No data to display  EKG   Radiology No results found.  Procedures Procedures (including critical care time)  Medications Ordered in UC Medications - No data to display  Initial Impression / Assessment and Plan / UC Course  I have reviewed the triage vital signs and the nursing notes.  Pertinent labs & imaging results that were available during my care of the patient were reviewed by me and considered in my medical decision making (see chart for details).     Visual acuity deferred today with shared decision making as vision intact per patient.  Suspect conjunctivitis.  Will treat with Ocuflox drops, cool compresses, good handwashing.  Return for worsening symptoms.  Work note given.  Final Clinical Impressions(s) / UC Diagnoses   Final diagnoses:  Acute bacterial conjunctivitis of left eye   Discharge Instructions   None    ED Prescriptions     Medication Sig Dispense Auth. Provider   ofloxacin (OCUFLOX) 0.3 % ophthalmic solution Place 1 drop into the left eye 4 (four) times daily. 5 mL Corbin Dess, [place]      PDMP not reviewed this encounter.   Corbin Dess, [place] 11/26/23 1629

## 2024-02-28 ENCOUNTER — Other Ambulatory Visit: Payer: Self-pay

## 2024-02-28 ENCOUNTER — Encounter: Payer: Self-pay | Admitting: Emergency Medicine

## 2024-02-28 ENCOUNTER — Ambulatory Visit
Admission: EM | Admit: 2024-02-28 | Discharge: 2024-02-28 | Disposition: A | Discharge status: Home / Self Care | Attending: Family Medicine | Admitting: Family Medicine

## 2024-02-28 DIAGNOSIS — H6991 Unspecified Eustachian tube disorder, right ear: Secondary | ICD-10-CM | POA: Diagnosis not present

## 2024-02-28 DIAGNOSIS — J3489 Other specified disorders of nose and nasal sinuses: Secondary | ICD-10-CM

## 2024-02-28 MED ORDER — AZELASTINE HCL 0.1 % NA SOLN: 1.0000 | Freq: Two times a day (BID) | NASAL | 0 refills | Status: AC

## 2024-02-28 NOTE — ED Provider Notes (Signed)
 [facility]    CSN: [id number] Arrival date & time: 02/28/24  1815      History   Chief Complaint Chief Complaint  Patient presents with   Otalgia    HPI Leah Moyer is a 63 y.o. female.   Patient presenting today with new onset right ear pressure and mild pain, mild right sided sore throat since earlier today.  Denies cough, significant nasal congestion, fever, chills, chest pain, shortness of breath, abdominal pain, vomiting, diarrhea.  So far not trying anything over the counter for symptoms.    History reviewed. No pertinent past medical history.  Patient Active Problem List   Diagnosis Date Noted   Encounter for screening colonoscopy 02/23/2022   Abdominal pain, epigastric 02/23/2022    Past Surgical History:  Procedure Laterality Date   ABDOMINAL HYSTERECTOMY     BACK SURGERY     CESAREAN SECTION     TUBAL LIGATION      OB History   No obstetric history on file.      Home Medications    Prior to Admission medications   Medication Sig Start Date End Date Taking? Authorizing Provider  azelastine  (ASTELIN ) 0.1 % nasal spray Place 1 spray into both nostrils 2 (two) times daily. Use in each nostril as directed 02/28/24  Yes Stuart Vernell Norris, PA-C  ofloxacin  (OCUFLOX ) 0.3 % ophthalmic solution Place 1 drop into the left eye 4 (four) times daily. 11/26/23   Stuart Vernell Norris, PA-C  pantoprazole  (PROTONIX ) 40 MG tablet Take 1 tablet (40 mg total) by mouth daily. 02/23/22   Carlan, Chelsea L, NP  polyethylene glycol-electrolytes (TRILYTE) 420 g solution Take 4,000 mLs by mouth as directed. 02/23/22   Eartha Angelia Sieving, MD  sucralfate  (CARAFATE ) 1 g tablet Take 1 tablet (1 g total) by mouth 3 (three) times daily as needed. May dissolve 1 tab into glass of water and drink as needed 02/20/22   Stuart Vernell Norris, PA-C    Family History Family History  Problem Relation Age of Onset   Breast cancer Paternal Aunt 23    Social  History Social History   Tobacco Use   Smoking status: Every Day    Current packs/day: 1.00    Types: Cigarettes   Smokeless tobacco: Never  Vaping Use   Vaping status: Never Used  Substance Use Topics   Alcohol use: Never   Drug use: Never     Allergies   Alpha-gal, Codeine, Ibuprofen, Penicillins, and Sulfa antibiotics   Review of Systems Review of Systems Per HPI  Physical Exam Triage Vital Signs ED Triage Vitals  Encounter Vitals Group     BP 02/28/24 1823 (!) 148/88     Girls Systolic BP Percentile --      Girls Diastolic BP Percentile --      Boys Systolic BP Percentile --      Boys Diastolic BP Percentile --      Pulse Rate 02/28/24 1823 87     Resp 02/28/24 1823 20     Temp 02/28/24 1823 98.4 F (36.9 C)     Temp Source 02/28/24 1823 Oral     SpO2 02/28/24 1823 96 %     Weight --      Height --      Head Circumference --      Peak Flow --      Pain Score 02/28/24 1822 7     Pain Loc --      Pain Education --  Exclude from Growth Chart --    No data found.  Updated Vital Signs BP (!) 148/88 (BP Location: Right Arm)   Pulse 87   Temp 98.4 F (36.9 C) (Oral)   Resp 20   SpO2 96%   Visual Acuity Right Eye Distance:   Left Eye Distance:   Bilateral Distance:    Right Eye Near:   Left Eye Near:    Bilateral Near:     Physical Exam Vitals and nursing note reviewed.  Constitutional:      Appearance: Normal appearance. She is not ill-appearing.  HENT:     Head: Atraumatic.     Ears:     Comments: Mild right middle ear effusion.    Nose: Nose normal.     Mouth/Throat:     Mouth: Mucous membranes are moist.     Pharynx: No oropharyngeal exudate or posterior oropharyngeal erythema.  Eyes:     Extraocular Movements: Extraocular movements intact.     Conjunctiva/sclera: Conjunctivae normal.  Cardiovascular:     Rate and Rhythm: Normal rate and regular rhythm.     Heart sounds: Normal heart sounds.  Pulmonary:     Effort: Pulmonary  effort is normal.     Breath sounds: Normal breath sounds.  Musculoskeletal:        General: Normal range of motion.     Cervical back: Normal range of motion and neck supple.  Lymphadenopathy:     Cervical: No cervical adenopathy.  Skin:    General: Skin is warm and dry.  Neurological:     Mental Status: She is alert and oriented to person, place, and time.  Psychiatric:        Mood and Affect: Mood normal.        Thought Content: Thought content normal.        Judgment: Judgment normal.      UC Treatments / Results  Labs (all labs ordered are listed, but only abnormal results are displayed) Labs Reviewed - No data to display  EKG   Radiology No results found.  Procedures Procedures (including critical care time)  Medications Ordered in UC Medications - No data to display  Initial Impression / Assessment and Plan / UC Course  I have reviewed the triage vital signs and the nursing notes.  Pertinent labs & imaging results that were available during my care of the patient were reviewed by me and considered in my medical decision making (see chart for details).     Possibly new onset viral illness, treat with Astelin  nasal spray, supportive over-the-counter medications and home care such as Coricidin HBP.  Return for worsening symptoms.  Final Clinical Impressions(s) / UC Diagnoses   Final diagnoses:  Acute dysfunction of right eustachian tube  Sinus drainage     Discharge Instructions      In addition to the Astelin  nasal spray, you may take Coricidin HBP, use saline sinus rinses, humidifiers, Tylenol  as needed.  I suspect you may have a viral illness coming on or this may be related to the weather changes outside.    ED Prescriptions     Medication Sig Dispense Auth. Provider   azelastine  (ASTELIN ) 0.1 % nasal spray Place 1 spray into both nostrils 2 (two) times daily. Use in each nostril as directed 30 mL Stuart Vernell Norris, PA-C      PDMP not  reviewed this encounter.   Stuart Vernell Norris, [place] 02/28/24 1955

## 2024-02-28 NOTE — Discharge Instructions (Signed)
 In addition to the Astelin  nasal spray, you may take Coricidin HBP, use saline sinus rinses, humidifiers, Tylenol  as needed.  I suspect you may have a viral illness coming on or this may be related to the weather changes outside.

## 2024-02-28 NOTE — ED Triage Notes (Signed)
 Pt reports right ear pain and right sided throat pain since earlier today. Denies any known fever or other symptoms at this time.

## 2024-06-10 ENCOUNTER — Ambulatory Visit
Admission: EM | Admit: 2024-06-10 | Discharge: 2024-06-10 | Disposition: A | Discharge status: Home / Self Care | Attending: Family Medicine | Admitting: Family Medicine

## 2024-06-10 ENCOUNTER — Encounter: Payer: Self-pay | Admitting: Emergency Medicine

## 2024-06-10 DIAGNOSIS — J329 Chronic sinusitis, unspecified: Secondary | ICD-10-CM

## 2024-06-10 DIAGNOSIS — B9789 Other viral agents as the cause of diseases classified elsewhere: Secondary | ICD-10-CM | POA: Diagnosis not present

## 2024-06-10 DIAGNOSIS — H65192 Other acute nonsuppurative otitis media, left ear: Secondary | ICD-10-CM | POA: Diagnosis not present

## 2024-06-10 MED ORDER — AZELASTINE HCL 0.1 % NA SOLN: 1.0000 | Freq: Two times a day (BID) | NASAL | 0 refills | Status: AC

## 2024-06-10 MED ORDER — PROMETHAZINE-DM 6.25-15 MG/5ML PO SYRP: 5.0000 mL | ORAL_SOLUTION | Freq: Four times a day (QID) | ORAL | 0 refills | Status: AC | PRN

## 2024-06-10 MED ORDER — PREDNISONE 20 MG PO TABS: 40.0000 mg | ORAL_TABLET | Freq: Every day | ORAL | 0 refills | Status: AC

## 2024-06-10 NOTE — Discharge Instructions (Signed)
 In addition to the prescribed medications, you may continue Coricidin HBP, plain Mucinex, saline sinus rinses, humidifiers, over-the-counter pain relievers as needed.  Follow-up for significantly worsening symptoms.

## 2024-06-10 NOTE — ED Provider Notes (Signed)
 [facility]    CSN: [id number] Arrival date & time: 06/10/24  0857      History   Chief Complaint No chief complaint on file.   HPI Leah Moyer is a 63 y.o. female.   Patient presenting today with 4-day history of progressively worsening nasal congestion, rhinorrhea, sinus pressure, sinus headache, productive cough.  Denies fever, chills, chest pain, shortness of breath, abdominal pain, vomiting, diarrhea.  Trying Coricidin HBP and Tylenol  with minimal relief.  Home COVID test negative this morning.    History reviewed. No pertinent past medical history.  Patient Active Problem List   Diagnosis Date Noted   Encounter for screening colonoscopy 02/23/2022   Abdominal pain, epigastric 02/23/2022    Past Surgical History:  Procedure Laterality Date   ABDOMINAL HYSTERECTOMY     BACK SURGERY     CESAREAN SECTION     TUBAL LIGATION      OB History   No obstetric history on file.      Home Medications    Prior to Admission medications   Medication Sig Start Date End Date Taking? Authorizing Provider  azelastine  (ASTELIN ) 0.1 % nasal spray Place 1 spray into both nostrils 2 (two) times daily. Use in each nostril as directed 06/10/24  Yes Stuart Vernell Norris, PA-C  predniSONE  (DELTASONE ) 20 MG tablet Take 2 tablets (40 mg total) by mouth daily with breakfast. 06/10/24  Yes Stuart Vernell Norris, PA-C  promethazine -dextromethorphan (PROMETHAZINE -DM) 6.25-15 MG/5ML syrup Take 5 mLs by mouth 4 (four) times daily as needed. 06/10/24  Yes Stuart Vernell Norris, PA-C  azelastine  (ASTELIN ) 0.1 % nasal spray Place 1 spray into both nostrils 2 (two) times daily. Use in each nostril as directed 02/28/24   Stuart Vernell Norris, PA-C  ofloxacin  (OCUFLOX ) 0.3 % ophthalmic solution Place 1 drop into the left eye 4 (four) times daily. 11/26/23   Stuart Vernell Norris, PA-C  pantoprazole  (PROTONIX ) 40 MG tablet Take 1 tablet (40 mg total) by mouth daily. 02/23/22    Carlan, Chelsea L, NP  polyethylene glycol-electrolytes (TRILYTE) 420 g solution Take 4,000 mLs by mouth as directed. 02/23/22   Eartha Angelia Sieving, MD  sucralfate  (CARAFATE ) 1 g tablet Take 1 tablet (1 g total) by mouth 3 (three) times daily as needed. May dissolve 1 tab into glass of water and drink as needed 02/20/22   Stuart Vernell Norris, PA-C    Family History Family History  Problem Relation Age of Onset   Breast cancer Paternal Aunt 30    Social History Social History   Tobacco Use   Smoking status: Every Day    Current packs/day: 1.00    Types: Cigarettes   Smokeless tobacco: Never  Vaping Use   Vaping status: Never Used  Substance Use Topics   Alcohol use: Never   Drug use: Never     Allergies   Alpha-gal, Codeine, Ibuprofen, Penicillins, and Sulfa antibiotics   Review of Systems Review of Systems PER HPI  Physical Exam Triage Vital Signs ED Triage Vitals  Encounter Vitals Group     BP 06/10/24 0902 (!) 171/99     Girls Systolic BP Percentile --      Girls Diastolic BP Percentile --      Boys Systolic BP Percentile --      Boys Diastolic BP Percentile --      Pulse Rate 06/10/24 0902 (!) 113     Resp 06/10/24 0902 18     Temp 06/10/24 0902 98.3 F (36.8  C)     Temp Source 06/10/24 0902 Oral     SpO2 06/10/24 0902 94 %     Weight --      Height --      Head Circumference --      Peak Flow --      Pain Score 06/10/24 0904 8     Pain Loc --      Pain Education --      Exclude from Growth Chart --    No data found.  Updated Vital Signs BP 126/86 (BP Location: Right Arm)   Pulse (!) 107   Temp 98.3 F (36.8 C) (Oral)   Resp 18   SpO2 97%   Visual Acuity Right Eye Distance:   Left Eye Distance:   Bilateral Distance:    Right Eye Near:   Left Eye Near:    Bilateral Near:     Physical Exam Vitals and nursing note reviewed.  Constitutional:      Appearance: Normal appearance.  HENT:     Head: Atraumatic.     Right Ear:  Tympanic membrane and external ear normal.     Left Ear: External ear normal.     Ears:     Comments: Left TM significantly injected on the upper half.  No purulence    Nose: Rhinorrhea present.     Mouth/Throat:     Mouth: Mucous membranes are moist.     Pharynx: Posterior oropharyngeal erythema present.  Eyes:     Extraocular Movements: Extraocular movements intact.     Conjunctiva/sclera: Conjunctivae normal.  Cardiovascular:     Rate and Rhythm: Normal rate and regular rhythm.     Heart sounds: Normal heart sounds.  Pulmonary:     Effort: Pulmonary effort is normal.     Breath sounds: Normal breath sounds. No wheezing or rales.  Musculoskeletal:        General: Normal range of motion.     Cervical back: Normal range of motion and neck supple.  Skin:    General: Skin is warm and dry.  Neurological:     Mental Status: She is alert and oriented to person, place, and time.  Psychiatric:        Mood and Affect: Mood normal.        Thought Content: Thought content normal.      UC Treatments / Results  Labs (all labs ordered are listed, but only abnormal results are displayed) Labs Reviewed - No data to display  EKG   Radiology No results found.  Procedures Procedures (including critical care time)  Medications Ordered in UC Medications - No data to display  Initial Impression / Assessment and Plan / UC Course  I have reviewed the triage vital signs and the nursing notes.  Pertinent labs & imaging results that were available during my care of the patient were reviewed by me and considered in my medical decision making (see chart for details).     Suspect viral sinusitis.  Does have a significant middle ear effusion on the left which we will treat with prednisone  and treat sinusitis symptoms with Astelin , Phenergan  DM, supportive over-the-counter medications and home care.  Return for worsening or unresolving symptoms.  Final Clinical Impressions(s) / UC Diagnoses    Final diagnoses:  Viral sinusitis  Acute MEE (middle ear effusion), left     Discharge Instructions      In addition to the prescribed medications, you may continue Coricidin HBP, plain Mucinex, saline sinus  rinses, humidifiers, over-the-counter pain relievers as needed.  Follow-up for significantly worsening symptoms.    ED Prescriptions     Medication Sig Dispense Auth. Provider   predniSONE  (DELTASONE ) 20 MG tablet Take 2 tablets (40 mg total) by mouth daily with breakfast. 10 tablet Stuart Vernell Norris, PA-C   azelastine  (ASTELIN ) 0.1 % nasal spray Place 1 spray into both nostrils 2 (two) times daily. Use in each nostril as directed 30 mL Stuart Vernell Norris, PA-C   promethazine -dextromethorphan (PROMETHAZINE -DM) 6.25-15 MG/5ML syrup Take 5 mLs by mouth 4 (four) times daily as needed. 100 mL Stuart Vernell Norris, [place]      PDMP not reviewed this encounter.   Stuart Vernell Norris, PA-C 06/10/24 1438

## 2024-06-10 NOTE — ED Triage Notes (Signed)
 Head congestion, productive cough since Sunday.  Has been coricidin and tylenol .  States has a lot of head pressure and headache.  Home covid test this morning was negative.
